# Patient Record
Sex: Female | Born: 1960 | Race: Black or African American | Hispanic: No | Marital: Single
Health system: Southern US, Community
[De-identification: ages and names within clinical notes are randomized; demographics above are authoritative.]

---

## 2011-04-24 ENCOUNTER — Emergency Department: Payer: Self-pay | Admitting: Emergency Medicine

## 2011-04-28 ENCOUNTER — Inpatient Hospital Stay: Payer: Self-pay | Admitting: Psychiatry

## 2012-04-16 ENCOUNTER — Inpatient Hospital Stay: Payer: Self-pay | Admitting: Psychiatry

## 2012-04-16 LAB — DRUG SCREEN, URINE
Barbiturates, Ur Screen: NEGATIVE (ref ?–200)
Cannabinoid 50 Ng, Ur ~~LOC~~: NEGATIVE (ref ?–50)
Cocaine Metabolite,Ur ~~LOC~~: NEGATIVE (ref ?–300)
Methadone, Ur Screen: NEGATIVE (ref ?–300)
Phencyclidine (PCP) Ur S: NEGATIVE (ref ?–25)

## 2012-04-16 LAB — COMPREHENSIVE METABOLIC PANEL
Albumin: 3.9 g/dL (ref 3.4–5.0)
Alkaline Phosphatase: 58 U/L (ref 50–136)
Anion Gap: 7 (ref 7–16)
BUN: 14 mg/dL (ref 7–18)
Bilirubin,Total: 0.3 mg/dL (ref 0.2–1.0)
Calcium, Total: 8.4 mg/dL — ABNORMAL LOW (ref 8.5–10.1)
Chloride: 106 mmol/L (ref 98–107)
Co2: 25 mmol/L (ref 21–32)
Creatinine: 0.67 mg/dL (ref 0.60–1.30)
EGFR (African American): >60
EGFR (Non-African Amer.): >60
Glucose: 87 mg/dL (ref 65–99)
Osmolality: 276 (ref 275–301)
Potassium: 3.7 mmol/L (ref 3.5–5.1)
SGOT(AST): 26 U/L (ref 15–37)
SGPT (ALT): 21 U/L
Sodium: 138 mmol/L (ref 136–145)
Total Protein: 8.5 g/dL — ABNORMAL HIGH (ref 6.4–8.2)

## 2012-04-16 LAB — URINALYSIS, COMPLETE
Bilirubin,UR: NEGATIVE
Glucose,UR: NEGATIVE mg/dL (ref 0–75)
Nitrite: NEGATIVE
Ph: 5 (ref 4.5–8.0)
Protein: 30
RBC,UR: 7 /HPF (ref 0–5)
Specific Gravity: 1.03 (ref 1.003–1.030)
Squamous Epithelial: 12
WBC UR: 5 /HPF (ref 0–5)

## 2012-04-16 LAB — CBC
HGB: 10.3 g/dL — ABNORMAL LOW (ref 12.0–16.0)
MCH: 23.2 pg — ABNORMAL LOW (ref 26.0–34.0)
MCHC: 31.4 g/dL — ABNORMAL LOW (ref 32.0–36.0)
MCV: 74 fL — ABNORMAL LOW (ref 80–100)
RBC: 4.43 10*6/uL (ref 3.80–5.20)
WBC: 4.8 10*3/uL (ref 3.6–11.0)

## 2012-04-16 LAB — SALICYLATE LEVEL: Salicylates, Serum: 2.2 mg/dL

## 2012-04-16 LAB — ACETAMINOPHEN LEVEL: Acetaminophen: <2 ug/mL

## 2012-04-16 LAB — ETHANOL: Ethanol %: <0.003 % (ref 0.000–0.080)

## 2012-04-17 LAB — URINE CULTURE

## 2012-04-19 LAB — IRON AND TIBC
Iron Bind.Cap.(Total): 399 ug/dL (ref 250–450)
Iron: 11 ug/dL — ABNORMAL LOW (ref 50–170)
Unbound Iron-Bind.Cap.: 388 ug/dL

## 2012-06-17 LAB — COMPREHENSIVE METABOLIC PANEL
Albumin: 3.2 g/dL — ABNORMAL LOW (ref 3.4–5.0)
BUN: 15 mg/dL (ref 7–18)
Bilirubin,Total: 0.2 mg/dL (ref 0.2–1.0)
Chloride: 108 mmol/L — ABNORMAL HIGH (ref 98–107)
Co2: 28 mmol/L (ref 21–32)
Creatinine: 0.65 mg/dL (ref 0.60–1.30)
EGFR (African American): >60
EGFR (Non-African Amer.): >60
Osmolality: 285 (ref 275–301)
SGOT(AST): 29 U/L (ref 15–37)
SGPT (ALT): 19 U/L
Sodium: 143 mmol/L (ref 136–145)
Total Protein: 7.2 g/dL (ref 6.4–8.2)

## 2012-06-17 LAB — DRUG SCREEN, URINE
Amphetamines, Ur Screen: NEGATIVE (ref ?–1000)
Benzodiazepine, Ur Scrn: NEGATIVE (ref ?–200)
Cocaine Metabolite,Ur ~~LOC~~: NEGATIVE (ref ?–300)
MDMA (Ecstasy)Ur Screen: NEGATIVE (ref ?–500)
Methadone, Ur Screen: NEGATIVE (ref ?–300)
Phencyclidine (PCP) Ur S: NEGATIVE (ref ?–25)

## 2012-06-17 LAB — TSH: Thyroid Stimulating Horm: 0.4 u[IU]/mL — ABNORMAL LOW

## 2012-06-17 LAB — ETHANOL
Ethanol %: <0.003 % (ref 0.000–0.080)
Ethanol: <3 mg/dL

## 2012-06-17 LAB — URINALYSIS, COMPLETE
Bacteria: NONE SEEN
Bilirubin,UR: NEGATIVE
Glucose,UR: NEGATIVE mg/dL (ref 0–75)
Hyaline Cast: 21
Ketone: NEGATIVE
Nitrite: NEGATIVE
Protein: NEGATIVE
Specific Gravity: 1.026 (ref 1.003–1.030)
Squamous Epithelial: 1
WBC UR: 3 /HPF (ref 0–5)

## 2012-06-17 LAB — CBC
MCHC: 29.8 g/dL — ABNORMAL LOW (ref 32.0–36.0)
Platelet: 296 10*3/uL (ref 150–440)
RBC: 4.23 10*6/uL (ref 3.80–5.20)
RDW: 22.3 % — ABNORMAL HIGH (ref 11.5–14.5)

## 2012-06-19 ENCOUNTER — Inpatient Hospital Stay: Payer: Self-pay | Admitting: Psychiatry

## 2012-06-24 LAB — CBC
HGB: 9.4 g/dL — ABNORMAL LOW (ref 12.0–16.0)
MCH: 24.6 pg — ABNORMAL LOW (ref 26.0–34.0)
MCHC: 32.7 g/dL (ref 32.0–36.0)
MCV: 75 fL — ABNORMAL LOW (ref 80–100)
RBC: 3.83 10*6/uL (ref 3.80–5.20)
RDW: 21.5 % — ABNORMAL HIGH (ref 11.5–14.5)

## 2012-06-24 LAB — FOLATE: Folic Acid: 10 ng/mL (ref 3.1–100.0)

## 2014-05-18 IMAGING — CT CT HEAD WITHOUT CONTRAST
2 series · 16 of 30 positions shown, 20 images · non-contrast
Comparison: none

REASON FOR EXAM: Cognitive Decline w/u
COMMENTS:

PROCEDURE:     CT  - CT HEAD WITHOUT CONTRAST  - May 07, 2012 [DATE]
RESULT:     Comparison:  None
TECHNIQUE: Multiple axial images from the foramen magnum to the vertex were
obtained without IV contrast.

[Series 2: without · axial · non-contrast · 0.42mm/px · z∈[+332,+462]mm · 13 of 32 slices shown, 17 images]
[im 3/32  brain]
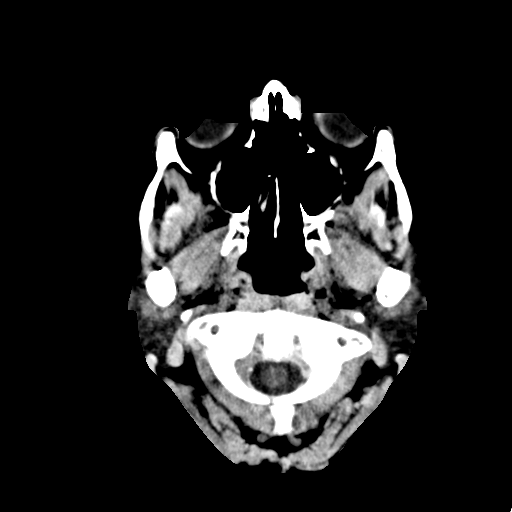
[im 3/32  bone]
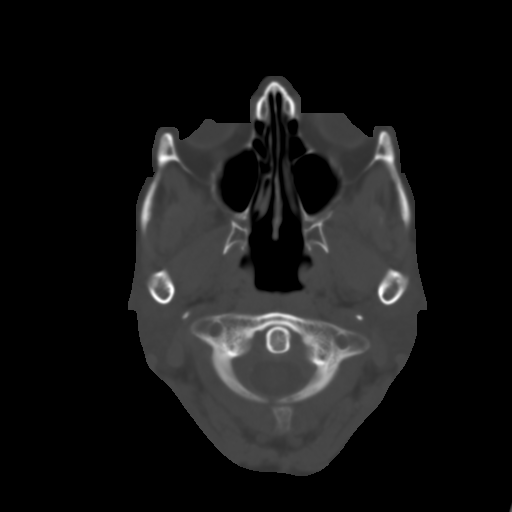
[im 5/32  brain]
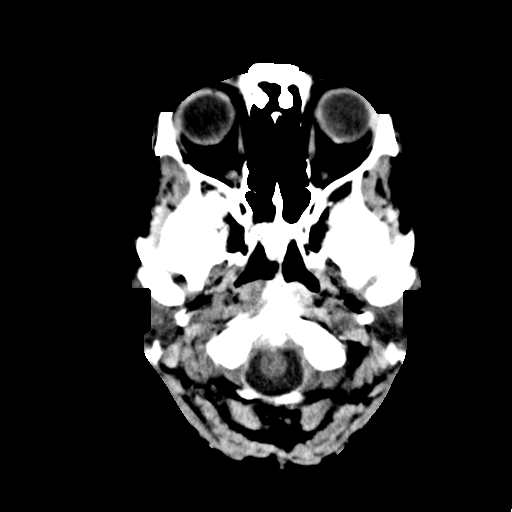
[im 7/32  brain]
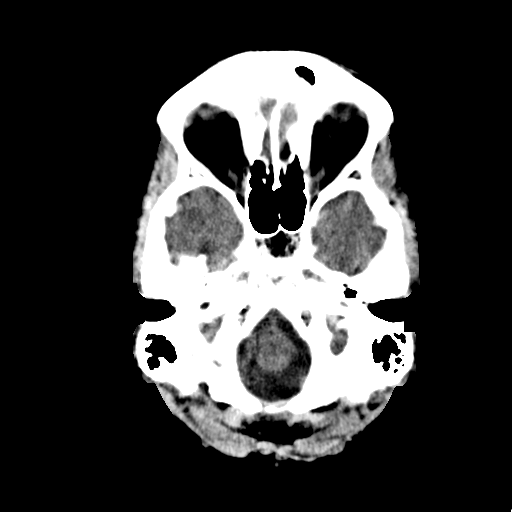
[im 9/32  brain]
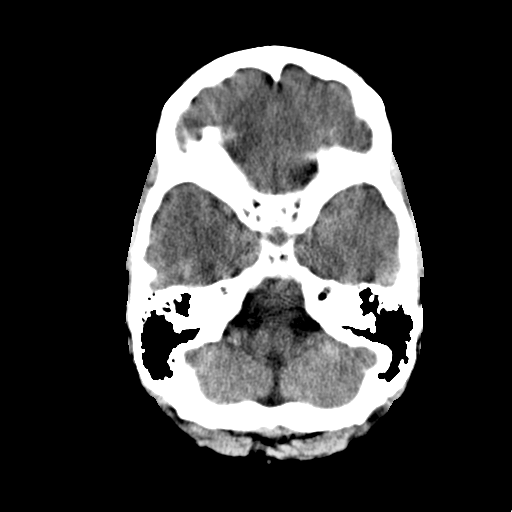
[im 12/32  brain]
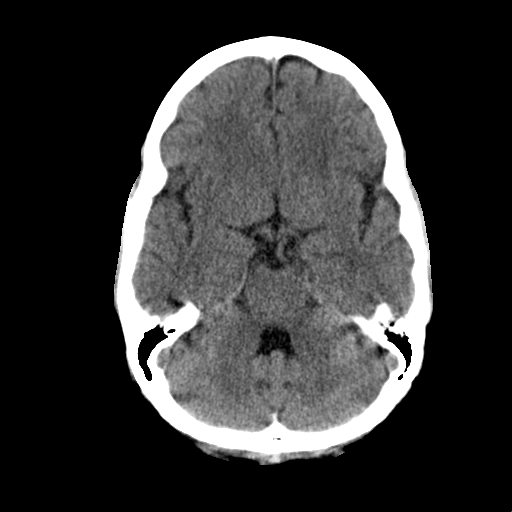
[im 12/32  bone]
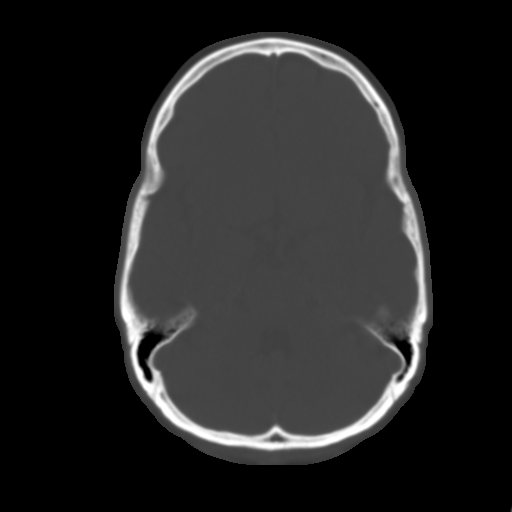
[im 14/32  brain]
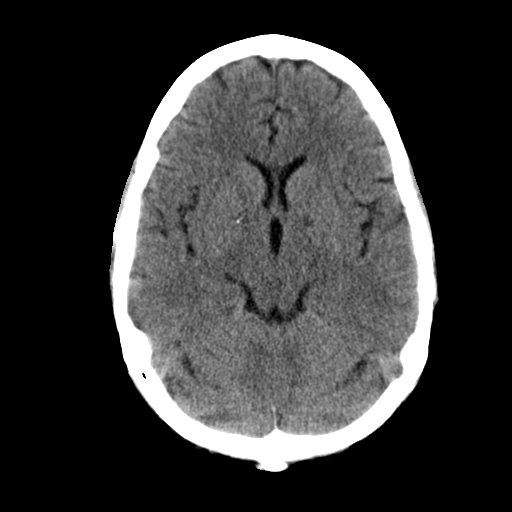
[im 16/32  brain]
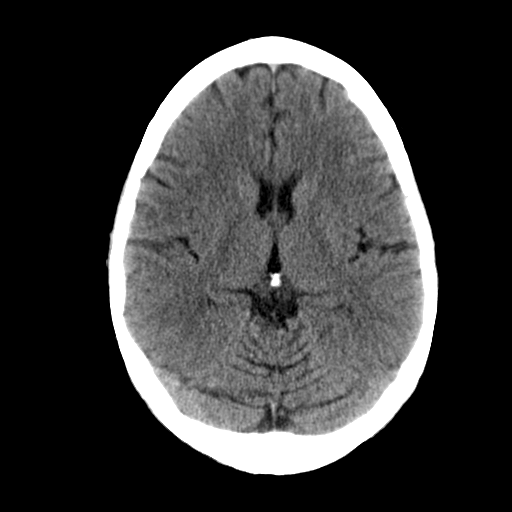
[im 18/32  brain]
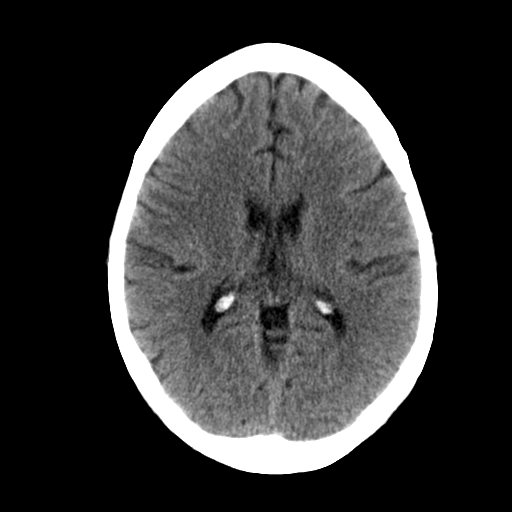
[im 20/32  brain]
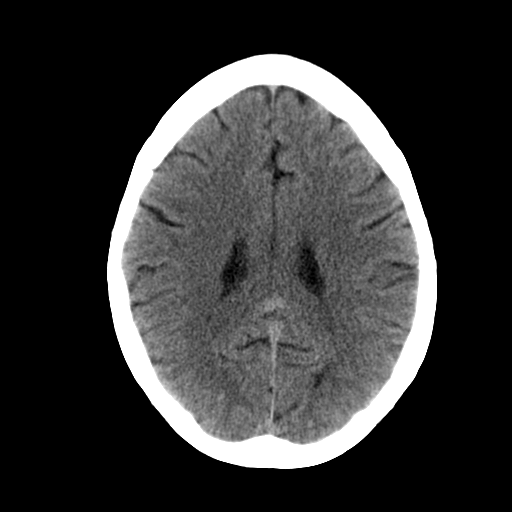
[im 20/32  bone]
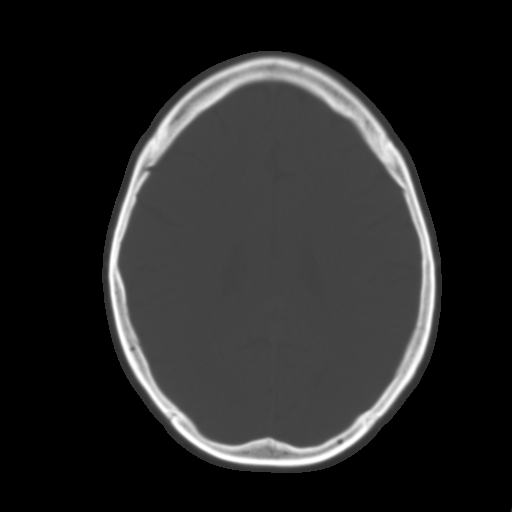
[im 23/32  brain]
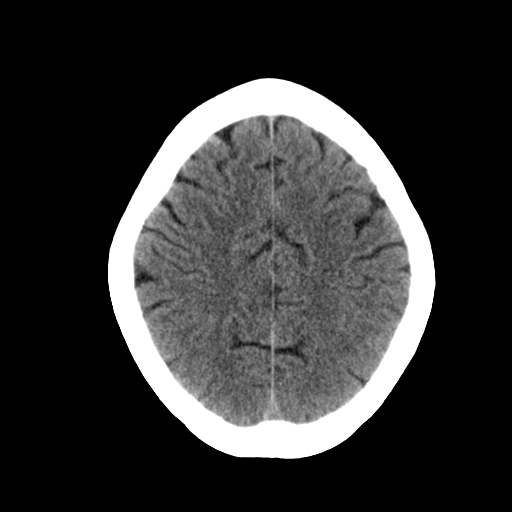
[im 25/32  brain]
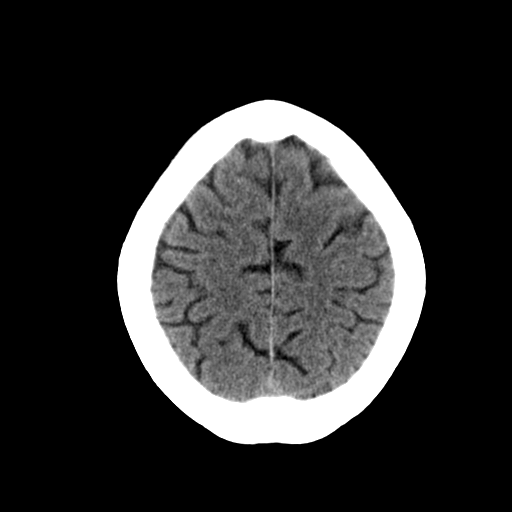
[im 27/32  brain]
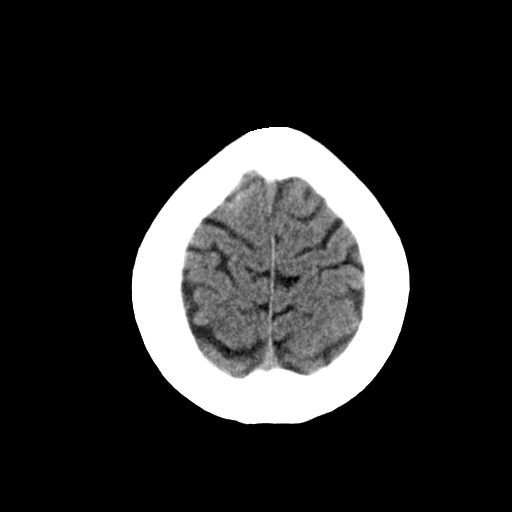
[im 29/32  brain]
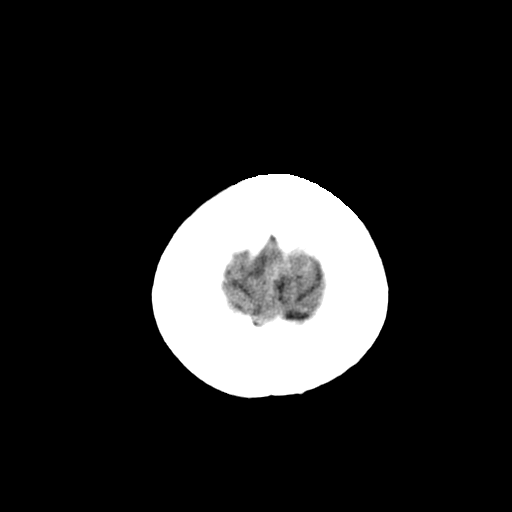
[im 29/32  bone]
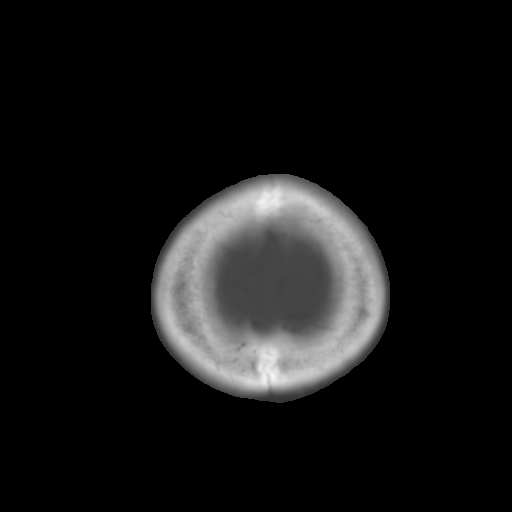

[Series 3: bone · axial · 0.42mm/px · z∈[+332,+378]mm · 3 of 32 slices shown]
[im 3/32  bone]
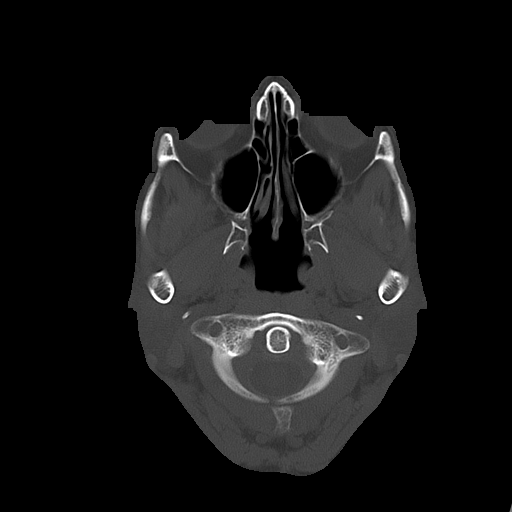
[im 7/32  bone]
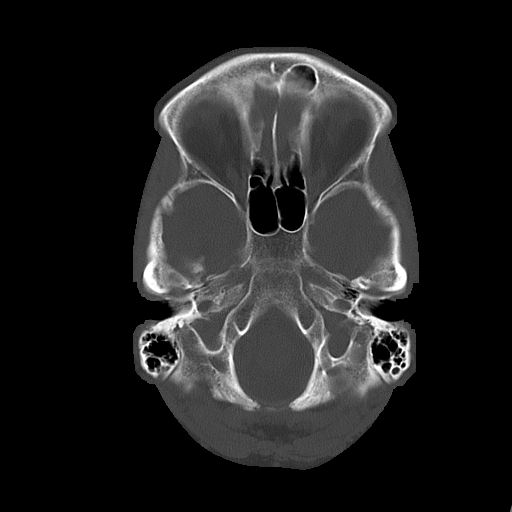
[im 12/32  bone]
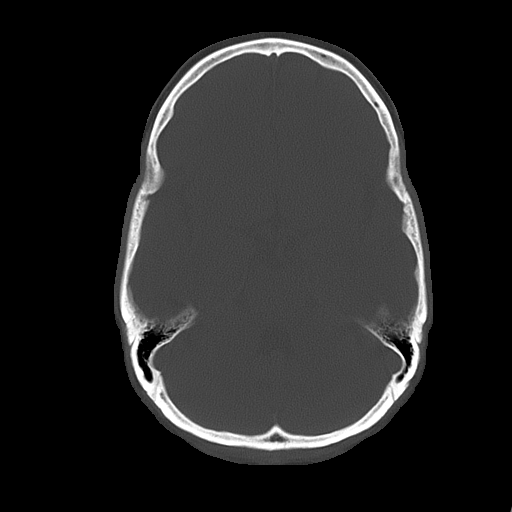

[16 of 30 positions shown; findings below may reference images not displayed]

FINDINGS: There is no evidence of mass effect, midline shift, or extra-axial fluid
collections.  There is no evidence of a space-occupying lesion or
intracranial hemorrhage. There is no evidence of a cortical-based area of
acute infarction.

The ventricles and sulci are appropriate for the patient's age. The basal
cisterns are patent.

Visualized portions of the orbits are unremarkable. The visualized portions
of the paranasal sinuses and mastoid air cells are unremarkable.

The osseous structures are unremarkable.
IMPRESSION: No acute intracranial process.

[REDACTED]

## 2015-03-18 NOTE — H&P (Signed)
PATIENT NAME:  Cynthia Kane, Cynthia Kane MR#:  [id number] DATE OF BIRTH:  1961/06/16  DATE OF ADMISSION:  06/19/2012  REFERRING PHYSICIAN: Dr. Daryel November.   ATTENDING PHYSICIAN: Kristine Linea, M.D.   IDENTIFYING DATA: Cynthia Kane is a 54 year old woman with a history of substance abuse and mood instability. .   CHIEF COMPLAINT: I overdosed.   HISTORY OF PRESENT ILLNESS: Cynthia Kane has a long history of depression and mood instability. She has been under considerable stress and reportedly overdosed on Celexa. It is unclear because at another time she is telling us that her fiance took away medications from her. The patient reports poor sleep, decreased appetite, anhedonia, feeling of guilt, worthlessness, hopelessness, poor memory and concentration, social isolation, and suicidal ideation with a plan. She reports being compliant with medications prescribed by [facility]. However, given her overall situation, it is unclear if the patient is able to afford her medication as she has no insurance. The patient reports one-year period of sobriety. The last time when she was hospitalized with me, she was just discharged from [facility] rehab facility and struggled with substance use. Reportedly, she is clean now. The patient endorses again cognitive decline. She was diagnosed with low vitamin B12 during one of her previous admissions.   PAST PSYCHIATRIC HISTORY: Several hospitalizations including one month admission to our hospital with Dr. Jeanie Sewer, several substance abuse treatments. There is a history of suicide attempt by medication overdose.   FAMILY PSYCHIATRIC HISTORY: None reported.   PAST MEDICAL HISTORY:  1. Status post gastric bypass. 2. Low B12.   ALLERGIES: Penicillin.   MEDICATIONS ON ADMISSION:  1. Trazodone 150 at night. 2. Celexa 20 mg daily.  3. Multivitamin once daily.   SOCIAL HISTORY: She reports living with her boyfriend. She is unemployed. She applied for  disability, but has not been deemed disabled yet. 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 She does not have any family to speak of.   REVIEW OF SYSTEMS: CONSTITUTIONAL: No fevers or chills. No weight changes. EYES: No double or blurred vision. ENT: No hearing loss. RESPIRATORY: No shortness of breath or cough. CARDIOVASCULAR: No chest pain or orthopnea. GASTROINTESTINAL: No abdominal pain, nausea, vomiting, or diarrhea. GU: No incontinence or frequency. ENDOCRINE: No heat or cold intolerance. LYMPHATIC: No anemia or easy bruising. INTEGUMENTARY: No acne or rash. MUSCULOSKELETAL: No muscle or joint pain. NEUROLOGIC: No tingling or weakness. PSYCHIATRIC: See history of present illness for details.   PHYSICAL EXAMINATION:  VITAL SIGNS: Blood pressure 105/64, pulse 74, respirations 20, temperature 96.6.   GENERAL: This is a well-developed female in no acute distress.   HEENT: The pupils are equal, round, and reactive to light. Sclerae anicteric.   NECK: Supple. No thyromegaly.   LUNGS: Clear to auscultation. No dullness to percussion.   HEART: Regular rhythm and rate. No murmurs, rubs, or gallops.   ABDOMEN: Soft, nontender, nondistended. Positive bowel sounds.   MUSCULOSKELETAL: Normal muscle strength in all extremities.   SKIN: No rashes or bruises.   LYMPHATIC: No cervical adenopathy.   NEUROLOGIC: Cranial nerves II through XII are intact.   LABORATORY DATA: Chemistries are within normal limits. Blood alcohol level is 0. LFTs within normal limits. TSH 0.4. Urine tox screen negative for substances. CBC within normal limits except for low hemoglobin of 9.7 and MCV of 77. Urinalysis is not suggestive of urinary tract infection.   MENTAL STATUS EXAMINATION ON ADMISSION: The patient  is alert and oriented to person, place, time, and situation. She is pleasant, polite, and cooperative. She is well groomed and casually  dressed. She maintains minimal eye contact. Her speech is soft. Mood is depressed with flat affect. Thought processing is logical and goal oriented. Thought content: She denies suicidal or homicidal ideation and is able to contract for safety in the hospital but was admitted after voicing suicidal ideations and possibly overdosing on Celexa. There are no thoughts of hurting other people. There are no delusions or paranoia. There are no auditory or visual hallucinations. Her cognition is grossly intact even though she has many complaints of her cognitive function. She registers three out of three and recalls three out of three objects after five minutes. She can spell world forwards and backwards. She knows the current president. Her insight and judgment are limited.   SUICIDE RISK ASSESSMENT ON ADMISSION: This is a patient with a long history of depression and mood instability who just overdosed on medication.   DIAGNOSES:  AXIS I:  1. Mood disorder, not otherwise specified.  2. Alcohol dependence, reportedly in remission.   AXIS II: Deferred.   AXIS III:  1. Status post gastric bypass surgery. 2. B12 deficiency.   AXIS IV: Mental illness, substance abuse, relationship, housing, financial, employment, primary support.  AXIS V: GAF on admission 25.   PLAN: The patient was admitted to [facility] Behavioral Medicine unit for safety, stabilization and medication management. She was initially placed on suicide precautions and was closely monitored for any unsafe behaviors. She underwent full psychiatric and risk assessment. She received pharmacotherapy, individual and group psychotherapy, substance abuse counseling, and support from therapeutic milieu.  1. Suicidal ideation. The patient is able to contract for safety in the hospital.  2. Mood. We will continue Celexa and trazodone. The patient cannot afford any other medications.   DISPOSITION: To be established.    ____________________________ Ellin GoodieJolanta B. Jennet MaduroPucilowska, MD jbp:ap D: 06/19/2012 16:01:22 ET T: 06/19/2012 16:44:05 ET JOB#: [id number]  cc: Holmes Hays B. Jennet MaduroPucilowska, MD, <Dictator> Shari ProwsJOLANTA B Kaelynn Igo MD ELECTRONICALLY SIGNED 06/20/2012 4:43

## 2015-03-18 NOTE — Consult Note (Signed)
Brief Consult Note: Diagnosis: Major depressive disorder recurrent severe..   Patient was seen by consultant.   Recommend further assessment or treatment.   Orders entered.   Comments: Will admit to BMU.  Electronic Signatures: Kristine LineaPucilowska, Jolanta (MD)  (Signed 22-Jul-13 10:41)  Authored: Brief Consult Note   Last Updated: 22-Jul-13 10:41 by Kristine LineaPucilowska, Jolanta (MD)

## 2015-03-23 NOTE — Discharge Summary (Signed)
PATIENT NAME:  Cynthia Kane, Cynthia Kane MR#:  [id number] DATE OF BIRTH:  March 15, 1961  DATE OF ADMISSION:  04/16/2012 DATE OF DISCHARGE:  05/09/2012  HISTORY OF PRESENT ILLNESS: Ms. Cynthia Kane is a 54 year old female admitted to the inpatient behavioral health unit due to severe depression. She had been experiencing over three weeks of depressed mood, decreased energy, decreased concentration, anhedonia and thoughts of hopelessness as well as helplessness. 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   She was having financial difficulty with bills. She was thinking of overdosing on pills, however, her fiance brought her to the Emergency Department for further evaluation and treatment.   Ms. Cynthia Kane does have a history of prior major depressive episode and did require psychiatric admission two previous times. She had been treated successfully with Celexa 20 mg daily along with trazodone 200 mg at bedtime.    Please see the admission history and physical.  ANCILLARY CLINICAL DATA: Given that the patient was demonstrating some difficulty with recent memory recall she did undergo an organic work-up. An example of memory recall difficulty which was not consistent to her functioning past was when she did not bring her utensils back after lunchtime on her cafeteria tray. The nurses asked her where the utensils were and she could not remember where she had put them. The staff did recover the utensils from the table.   The patient also reported that she was having to write things down a lot more than she used to.  The undersigned did check her immediate recall as well as recall after five minutes. This was normal.    However, the patient did describe a period of over a year and a half where she drank alcohol every day to the point of sustained physiologic dependence and had very little calorie intake from other sources. It was after this time that she noted that she could not recall like she  used to be able to. The period of heavy drinking ended approximately 1-1/2 years ago.   The patient's organic work-up which involved additional tests on top of those done at admission involved an RPR, B12, folic acid and cranial CAT scan which were all negative.  HOSPITAL COURSE: Ms. Cynthia Kane was admitted to the inpatient medical health unit and underwent milieu and group psychotherapy. Her Celexa was increased but her depression did not improve.   She was changed to venlafaxine and the venlafaxine was titrated to 225 mg XR q.a.m. without adverse effect.   Her mood then did improve. She was showing normal social behavior in the milieu.   CONDITION ON DISCHARGE: By 06/11 Ms. Cynthia Kane was showing normal and appropriate social behavior. Her mood, interest and hope are normal. She has constructive future goals. She is oriented to all spheres. She is not showing any adverse medication effects.   MENTAL STATUS EXAM UPON DISCHARGE: Ms. Cynthia Kane is alert. Her eye contact is good. She is oriented to all spheres. 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 She continues to have to jot things down in a journal which she carries with her. Her fund of knowledge, intelligence, and use of language are normal. Her speech involves normal rate and prosody without dysarthria. Thought process is logical, coherent, and goal directed. No looseness of association or tangents. Thought content: No thoughts of harming herself. No thoughts  of harming others. No delusions or hallucinations. Insight is intact. Judgment is intact. Affect broad and appropriate. Mood within normal.   DISCHARGE DIAGNOSES:  AXIS I:  1. Major depressive disorder, recurrent, in clinical remission.  2. Alcohol dependence, in remission.  3. Unspecified cognitive and memory decline.   AXIS II:  Deferred.   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   AXIS IV: Occupational, financial.   AXIS V: 55.   Ms. Cynthia Kane is not at risk to harm herself or others. She agrees to call emergency services immediately for any thoughts of harming herself, thoughts of harming others, or distress.   She agrees to not drive if drowsy.   FOLLOW UP: [facility] with appointment 4:00 p.m. 06/13. She will also have follow up with a general medical physician within a week to 10 days of discharge. She will continue on her thiamine and will get a supplement over-the-counter at a lower cost. Her current dosage is 100 mg daily.   DIET: Regular.   ACTIVITY: Routine.   DISCHARGE MEDICATIONS:  1. Trazodone 100 mg at bedtime, #10.  2. Hydroxyzine 50 mg at bedtime p.r.n. insomnia, #10.  3. Venlafaxine XR 225 mg every a.m., #10.   12-step groups and sponsor.   She understands to obtain any further organic work-up. She can get further lab testing at her primary care physician as well as a referral to neurology for additional evaluation as well.  ____________________________ Adelene AmasJames S. Yareth Macdonnell, MD jsw:cms D: 05/09/2012 21:20:14 ET T: 05/10/2012 06:17:33 ET JOB#: [id number]  cc: Adelene AmasJames S. Maximillion Gill, MD, <Dictator> Lester CarolinaJAMES S Vivianna Piccini MD ELECTRONICALLY SIGNED 05/13/2012 10:24

## 2015-03-23 NOTE — H&P (Signed)
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 Patient and man friend live in a house and he is on disability and he is 54 years old. Patient is brought to [facility] Emergency Room by her man friend because she expressed thoughts of suicide and wanting to hurt herself.   HISTORY OF PRESENT ILLNESS: When patient was asked when she last felt well she reported "two or three weeks ago". Started feeling depressed and started feeling hopeless and helpless, worthless and useless. 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   PAST PSYCHIATRIC HISTORY: History of two previous inpatient hospitalization in psychiatry. First inpatient hospitalization in psychiatry was many years ago and cannot remember the date. Second inpatient hospitalization in psychiatry was in May 2012 when she reported that her memory was gone and she had been feeling depressed. She was inpatient from 04/28/2011 to 05/03/2011 at [facility] when her medications were adjusted and she was discharged on following medications: Celexa 20 mg p.o. daily, trazodone 200 mg at bedtime, vitamin B12 1000 mcg daily for seven days and once a week afterwards, vitamin B complex with folic acid 1 capsule daily. Patient reports that she has been taking her medications as prescribed. [facility] and last  appointment was a couple of weeks ago, next appointment is coming up in few weeks.   FAMILY HISTORY OF MENTAL ILLNESS: Patient reports one of her distant cousins had a nervous breakdown and she died of natural causes.   FAMILY HISTORY: Raised by parents. Father was a Science writer. Father died of complications of diabetes at age 68 years. Mother was a Haematologist. Mother died of congestive heart failure at age 75 years. Has one living brother and is 33 years old. Not close to family.   PERSONAL HISTORY: Born in [place]. Graduated from high school and has two years Associates Degree and has LPT. Moved from [place] to [place] about three years ago because her house foreclosed when she could not pay her payments after mother's death as mother was supplementing her income.   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 Job ended when she moved. 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   MILITARY HISTORY: None.   MARRIAGES: Never married. Dated a little. Currently she has a man friend who is on disability and is supportive. Has two children that are 50 and 31 years old. One lives in [place], one lives in [place]. Close to her children and is in touch with them.  ALCOHOL AND DRUGS: First drink of alcohol was 20 years. Had problems with alcohol  drinking. Started drinking heavy after the death of her mother. Had one DWI and [place] her license in [place]. Never arrested for public drunkenness. Currently she has been sober for a year with the help of AA and attends AA meetings on a regular basis. Has an AA sponsor. Denies street or prescription drug abuse. Denies using IV drugs. Does admit smoking nicotine cigarettes occasionally and not on regular basis.  PAST MEDICAL HISTORY: No known history of high blood pressure. No  known diabetes mellitus. Status post gastric bypass six years ago. No major injuries. No history of motor vehicle accident. Never been unconscious.  ALLERGIES: Penicillin.  PRIMARY CARE PHYSICIAN: Not being followed by any primary care physician at this time and goes to Emergency Room as needed.   PHYSICAL EXAMINATION: VITAL SIGNS: Temperature 97.8, pulse 70 per minute regular, respirations 18 per minute regular, blood pressure 114/74 mmHg.  HEENT: Head normocephalic, atraumatic. Pupils are equal, round, and reactive to light and accommodation. Fundi bilaterally benign. Extraocular movements visualized. Tympanic membrane visualized, no exudates. Mouth moist but edentulous and lost several teeth and the rest are in very poor shape.   NECK: Supple without any organomegaly, lymphadenopathy, thyromegaly.  CHEST: Normal expansion. Normal breath sounds heard.  HEART: Normal without any murmurs or gallops.   ABDOMEN: Soft, nontender, nondistended. Positive bowel sounds.   SKIN: No rashes. No bruises.  LYMPHATICS: No cervical lymphadenopathy.   RECTAL/PELVIC: Deferred.  NEUROLOGICAL: Gait is normal. Romberg is negative. Cranial nerves II through XII grossly intact. DTRs 2+ and normal. Plantars are normal response.   MENTAL STATUS EXAMINATION: Patient is dressed in hospital pajamas. Alert and oriented to place, person and time. Fully aware of situation brought [facility]. Affect is appropriate with mood which is low and down and depressed. Has flat affect. Thought processes are logical and goal directed. Maintains good eye contact and speech is soft. She wishes that she is not there anymore because of financial stresses and not able to find a job and she was highly employed [place] was in [place]. Did have some suicidal wishes but not anymore and she feels that she is being helped here. No evidence of psychosis. Denies auditory or visual hallucinations. Denies hearing voices,  seeing things. Denies paranoid or suspicious ideas. No evidence of delusions, paranoia. Cognition is grossly intact. Recall and memory are good. She could remember all the three objects asked to remember in few minutes, several minutes. Can spell the word world forward and backward without any problems. Sleep is disturbed and feels that she is not able to get good night's rest. Appetite is fair. Insight and judgment guarded.   IMPRESSION: AXIS I:  1. Mood disorder, not otherwise specified.  2. Alcohol dependence in remission. 3. 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  AXIS II: Deferred.  AXIS III: Status post gastric bypass.   AXIS IV: Long history of depression and occupational, not able to find a job, financial, problems with primary support and problems with access to health care.  AXIS V: Global Assessment of Functioning 25.  PLAN: Patient admitted to [facility] for close observation, evaluation and help. She will be started back on all of her medications which will be adjusted so that her symptoms will be under control. During her stay in the hospital she will be given milieu therapy and supportive counseling. She will take part in individual and group therapy where coping skills in dealing with stresses of life will be addressed. At  the time of discharge she will not be depressed, will not be feeling hopeless and helpless and will have enough coping skills. Appropriate follow-up appointments will be made at the time of discharge.   ____________________________ Jannet Mantis. Guss Bunde, MD skc:cms D: 04/16/2012 18:13:39 ET T: 04/17/2012 06:22:30 ET JOB#: [id number]  cc: Monika Salk K. Guss Bunde, MD, <Dictator> Beau Fanny MD ELECTRONICALLY SIGNED 04/22/2012 19:04
